# Patient Record
Sex: Male | Born: 1975 | Hispanic: Yes | Marital: Married | Smoking: Current some day smoker
Health system: Southern US, Community
[De-identification: ages and names within clinical notes are randomized; demographics above are authoritative.]

---

## 2011-07-11 ENCOUNTER — Ambulatory Visit
Admission: RE | Admit: 2011-07-11 | Discharge: 2011-07-11 | Disposition: A | Discharge status: Home / Self Care | Payer: No Typology Code available for payment source | Source: Ambulatory Visit | Attending: Emergency Medicine | Admitting: Emergency Medicine

## 2011-07-11 ENCOUNTER — Other Ambulatory Visit: Payer: Self-pay | Admitting: Emergency Medicine

## 2011-07-11 DIAGNOSIS — R1032 Left lower quadrant pain: Secondary | ICD-10-CM

## 2011-07-11 MED ORDER — IOHEXOL 300 MG/ML  SOLN: 100.0000 mL | Freq: Once | INTRAMUSCULAR | Status: AC | PRN

## 2012-01-05 IMAGING — CT CT ABD-PELV W/ CM
1 of 4 series · 15 of 36 positions shown, 19 images · IV contrast (30CC OMNI 300 & [ID] OMNI 300)
Comparison: None.

CLINICAL DATA: Left lower quadrant pain

CT ABDOMEN AND PELVIS WITH CONTRAST
TECHNIQUE: Multidetector CT imaging of the abdomen and pelvis was
performed following the standard protocol during bolus
administration of intravenous contrast.
Contrast: 100 ml Omnipaque 300

[Series 2: a&pw/ · axial · 0.70mm/px · z∈[-403,+32]mm · 15 of 94 slices shown, 19 images]
[im 4/94  soft-tissue]
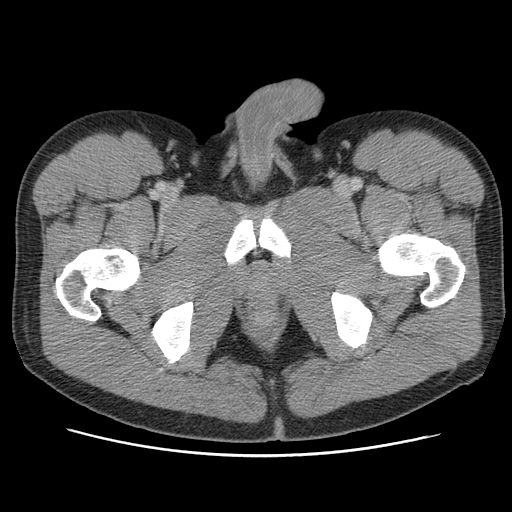
[im 4/94  bone]
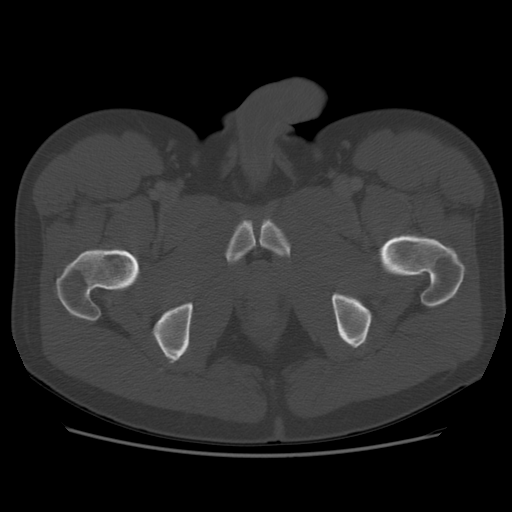
[im 12/94  soft-tissue]
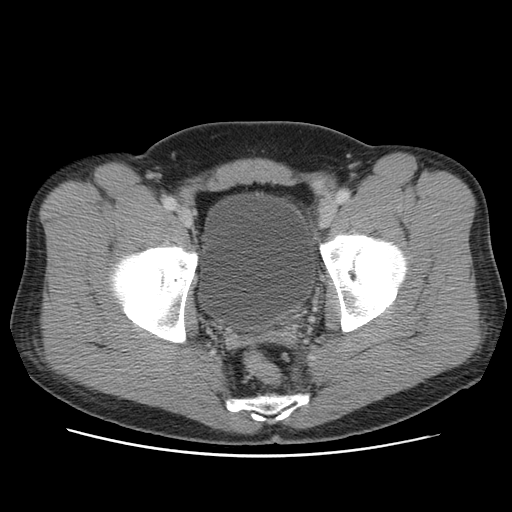
[im 20/94  soft-tissue]
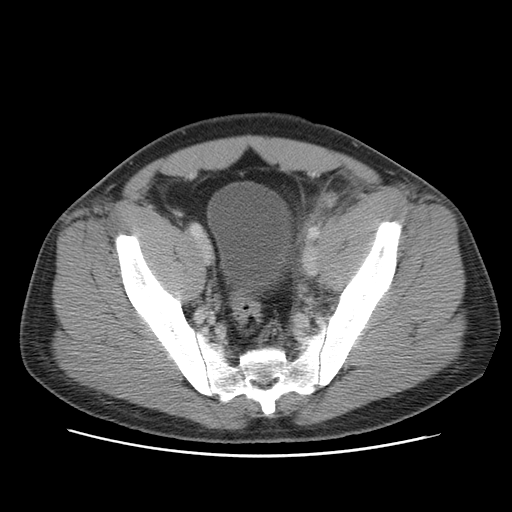
[im 28/94  soft-tissue]
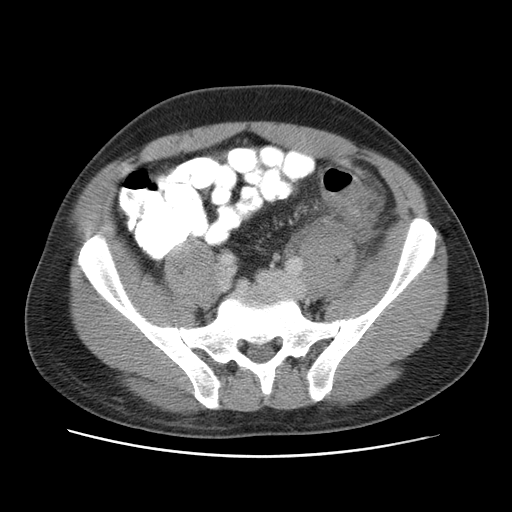
[im 32/94  soft-tissue]
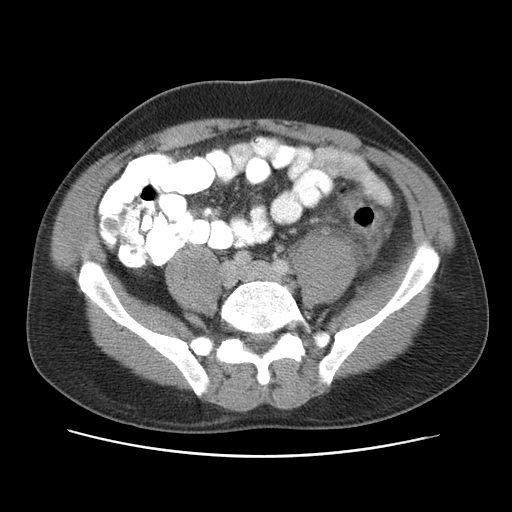
[im 39/94  soft-tissue]
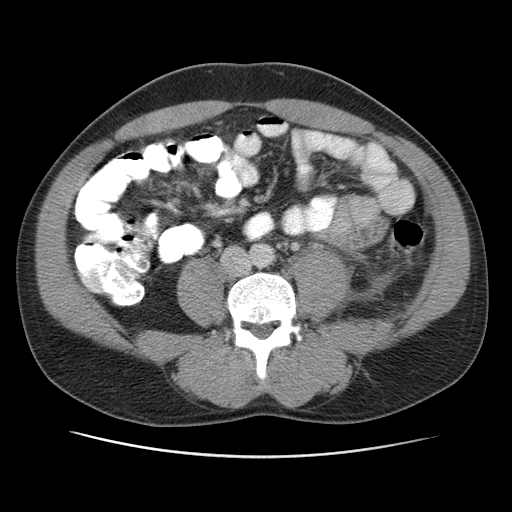
[im 47/94  soft-tissue]
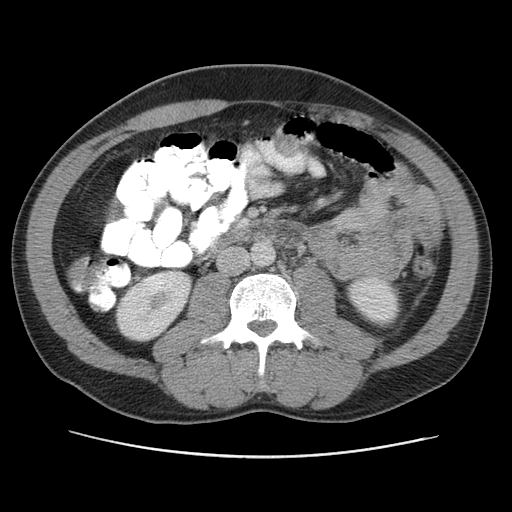
[im 55/94  soft-tissue]
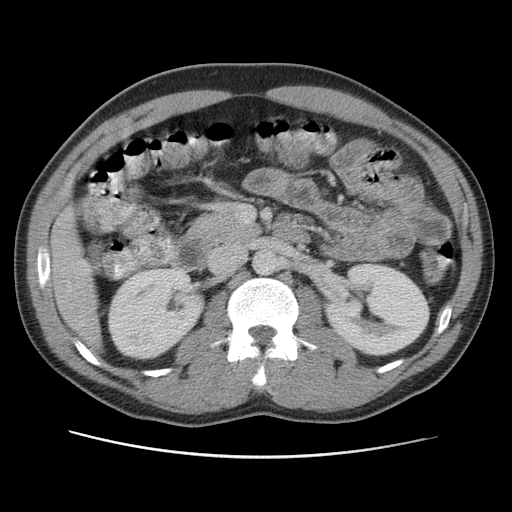
[im 63/94  soft-tissue]
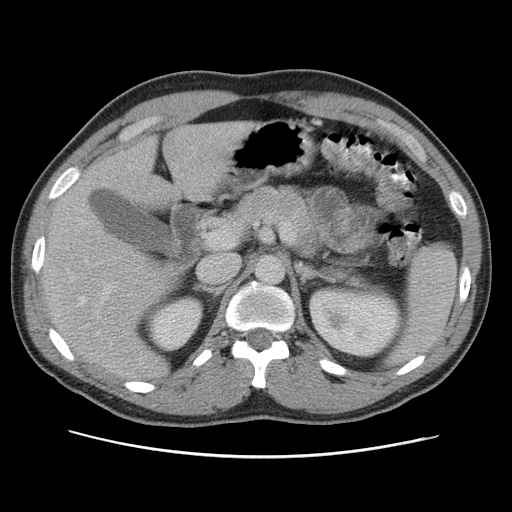
[im 63/94  bone]
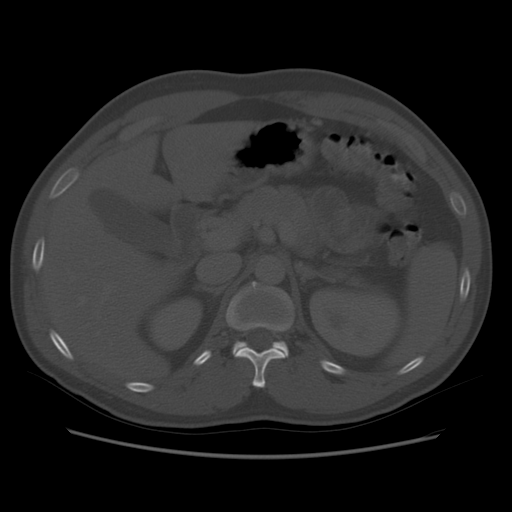
[im 66/94  soft-tissue]
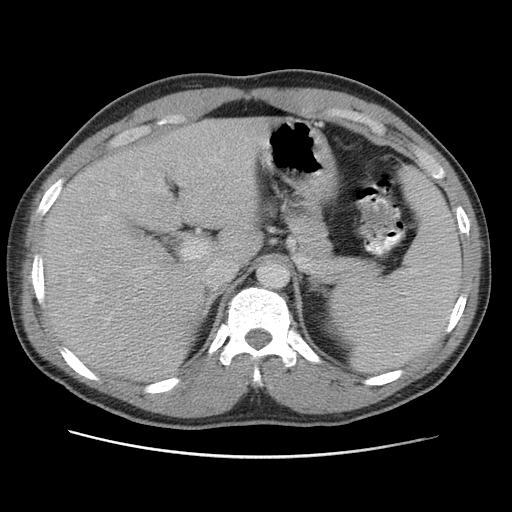
[im 74/94  soft-tissue]
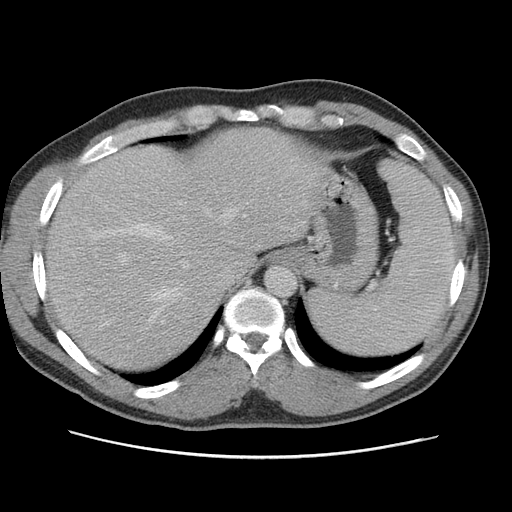
[im 78/94  lung]
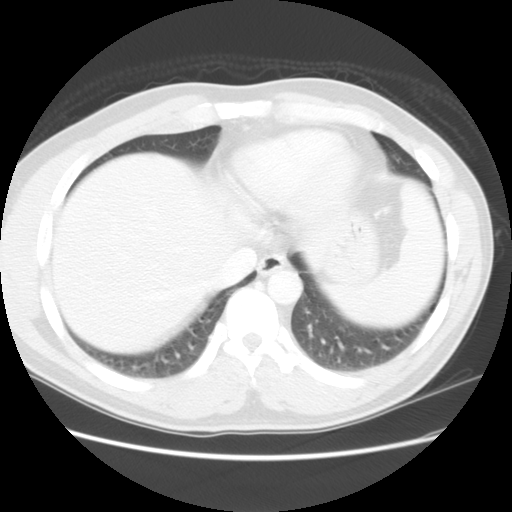
[im 82/94  soft-tissue]
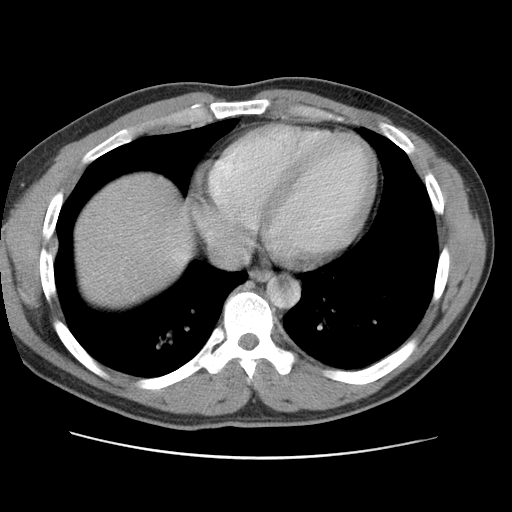
[im 82/94  lung]
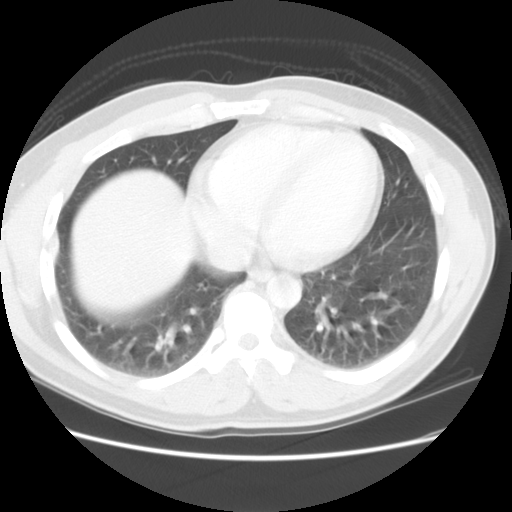
[im 86/94  lung]
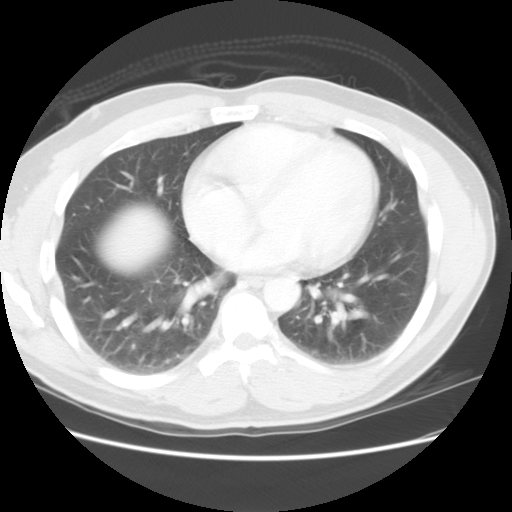
[im 90/94  soft-tissue]
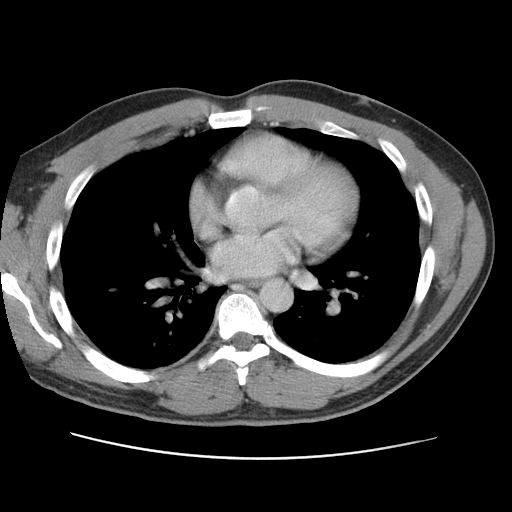
[im 90/94  lung]
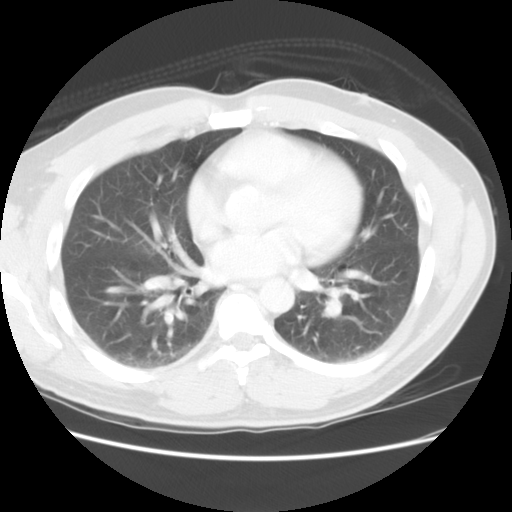

[15 of 36 positions shown; findings below may reference images not displayed]

FINDINGS: Lung bases are clear.  No pericardial fluid.

The liver, gallbladder, pancreas, spleen, adrenal glands, and
kidneys are normal.

The stomach, small bowel, and cecum are normal.  There is a coarse
calcification in the expected location of the appendix which
represent a 9 mm appendicolith.  There  are no secondary signs of
appendicitis.

Within the distal aspect the descending colon /  proximal aspect of
the sigmoid colon there is circumferential pericolonic
inflammation.  This is a region of multiple diverticula.  There is
thickening along the left pericolic gutter.  These findings are
most suggestive of acute diverticulitis.  No evidence of macro
perforation or abscess.  The more distal sigmoid colon and rectum
appear normal.

Abdominal aorta normal caliber.  No retroperitoneal
lymphadenopathy.

No free fluid the pelvis.  The bladder prostate gland appear
normal.  No pelvic lymphadenopathy.
IMPRESSION: 1.  Acute diverticulitis of the proximal sigmoid colon.  No
evidence of perforation or abscess.   Recommend follow up imaging
or colonoscopy to exclude underlying neoplasm.
2.  Appendicolith within the appendix without evidence of acute
appendicitis.

Findings were called to Dr. Bunji   by the CT technologist.

## 2014-05-31 ENCOUNTER — Ambulatory Visit (INDEPENDENT_AMBULATORY_CARE_PROVIDER_SITE_OTHER): Payer: Self-pay | Admitting: Emergency Medicine

## 2014-05-31 VITALS — BP 126/82 | HR 59 | Temp 98.6°F | Resp 20 | Ht 66.0 in | Wt 177.4 lb

## 2014-05-31 DIAGNOSIS — S6990XA Unspecified injury of unspecified wrist, hand and finger(s), initial encounter: Secondary | ICD-10-CM

## 2014-05-31 DIAGNOSIS — S6992XA Unspecified injury of left wrist, hand and finger(s), initial encounter: Secondary | ICD-10-CM

## 2014-05-31 DIAGNOSIS — Z23 Encounter for immunization: Secondary | ICD-10-CM

## 2014-05-31 DIAGNOSIS — S61412A Laceration without foreign body of left hand, initial encounter: Secondary | ICD-10-CM

## 2014-05-31 DIAGNOSIS — S61409A Unspecified open wound of unspecified hand, initial encounter: Secondary | ICD-10-CM

## 2014-05-31 NOTE — Progress Notes (Signed)
Procedure Note: Verbal consent obtained from the patient.  Local anesthesia with 2 cc Lidocaine 2% without epinephrine.  Wound scrubbed with soap and water.  Wound explored.  No foreign bodies or deep structure injury noted.  Wound closed with #3 sutures of 5-0 ethilon (#1HM, #2SI.)  Area cleansed and dressed.  Pt tolerated very well.  Discussed wound care.  Anticipate suture removal in 7-10 days.

## 2014-05-31 NOTE — Progress Notes (Signed)
   Subjective:    Patient ID: Mario Perry, male    DOB: 05/09/1976, 38 y.o.   MRN: [id number]  HPI 38 yo male with complaint of cutting hand with dry wall razor blade.  Laceration to left palm.  No difficulty with ROM.  Washed wound immediately.  Minimal pain.  No numbness or tingling distal to laceration.  PPMH:  Noncontributory  SH:  Nonsmoker, occasional alcohol   Review of Systems As per HPI, otherwise negative.    Objective:   Physical Exam Blood pressure 126/82, pulse 59, temperature 98.6 F (37 C), temperature source Oral, resp. rate 20, height 5\' 6"  (1.676 m), weight 177 lb 6 oz (80.457 kg), SpO2 96.00%. Body mass index is 28.64 kg/(m^2). Well-developed, well nourished male who is awake, alert and oriented, in NAD. HEENT: [place]/AT, PERRL, EOMI.  Sclera and conjunctiva are clear.   Lungs: normal effort Extremities: left hand with 1cm laceration to palm, just proximal to 5th MCP joint.  No vascular or tendon compromise on exam.  Sensation intact. Skin: warm and dry without rash. Psychologic: good mood and appropriate affect, normal speech and behavior.     Assessment & Plan:  1.  Laceration to hand 1cm repaired.  Tetanus updated today.

## 2014-05-31 NOTE — Patient Instructions (Signed)

## 2015-03-21 ENCOUNTER — Ambulatory Visit (INDEPENDENT_AMBULATORY_CARE_PROVIDER_SITE_OTHER): Payer: Self-pay | Admitting: Internal Medicine

## 2015-03-21 VITALS — BP 98/70 | HR 72 | Temp 98.3°F | Resp 16 | Ht 66.0 in | Wt 175.5 lb

## 2015-03-21 DIAGNOSIS — F1010 Alcohol abuse, uncomplicated: Secondary | ICD-10-CM

## 2015-03-21 DIAGNOSIS — R1031 Right lower quadrant pain: Secondary | ICD-10-CM

## 2015-03-21 DIAGNOSIS — Z8719 Personal history of other diseases of the digestive system: Secondary | ICD-10-CM

## 2015-03-21 LAB — POCT CBC
Granulocyte percent: 70.1 %G (ref 37–80)
HCT, POC: 44 % (ref 43.5–53.7)
Hemoglobin: 14.7 g/dL (ref 14.1–18.1)
LYMPH, POC: 1.9 (ref 0.6–3.4)
MCH: 31.1 pg (ref 27–31.2)
MCHC: 33.4 g/dL (ref 31.8–35.4)
MCV: 93 fL (ref 80–97)
MID (CBC): 0.8 (ref 0–0.9)
MPV: 6.9 fL (ref 0–99.8)
PLATELET COUNT, POC: 224 10*3/uL (ref 142–424)
POC Granulocyte: 6.4 (ref 2–6.9)
POC LYMPH %: 21 % (ref 10–50)
POC MID %: 8.9 % (ref 0–12)
RBC: 4.73 M/uL (ref 4.69–6.13)
RDW, POC: 12.2 %
WBC: 9.1 10*3/uL (ref 4.6–10.2)

## 2015-03-21 MED ORDER — CIPROFLOXACIN HCL 500 MG PO TABS: 500.0000 mg | ORAL_TABLET | Freq: Two times a day (BID) | ORAL | Status: AC

## 2015-03-21 MED ORDER — METRONIDAZOLE 500 MG PO TABS: 500.0000 mg | ORAL_TABLET | Freq: Three times a day (TID) | ORAL | Status: AC

## 2015-03-21 NOTE — Patient Instructions (Signed)
Diverticulitis (Diverticulitis) La diverticulitis es la inflamacin o infeccin de pequeas bolsas en el colon que se forman por una afeccin llamada diverticulosis. Las bolsas en el colon se denominan divertculos. El colon, o intestino grueso, es el lugar donde se absorbe agua y se forman las heces. [place] complicaciones de la diverticulitis, se incluyen:  Hemorragias.  Infeccin grave.  Dolor intenso.  Perforacin del colon.  Obstruccin del colon. CAUSAS  La diverticulitis est causada por bacterias y La diverticulitis se produce cuando queda materia fecal retenida en los divertculos. Esto permite que crezcan bacterias en los divertculos, lo que puede llevar a la inflamacin e infeccin. [place] con diverticulosis corren riesgo de presentar diverticulitis. Las dietas que no incluyen suficiente fibra proveniente de frutas y Photographer pueden predisponer a la diverticulitis. [place] los sntomas de diverticulitis, se incluyen:  Dolor y Scientist, research (life sciences) en el abdomen. El dolor en general aparece en el lado izquierdo del abdomen, pero puede aparecer en otras zonas.  Fiebre o escalofros  Hinchazn.  Clicos.  Nuseas.  Vmitos.  Estreimiento.  Diarrea.  Sangre en la materia fecal. DIAGNSTICO  El mdico le preguntar acerca de sus antecedentes mdicos y le har un examen fsico. Es posible que le hagan estudios, porque hay muchas enfermedades que pueden causar los mismos sntomas que la diverticulitis. Los estudios pueden incluir:  Anlisis de [place].  Anlisis de [place].  Estudios por imgenes del abdomen, entre ellos, radiografas y tomografas computarizadas. Cuando la afeccin est controlada, el mdico puede recomendarle que se haga una colonoscopa. La colonoscopa puede mostrar la gravedad de los divertculos y si hay algo ms que est causando los sntomas. TRATAMIENTO  La mayora de los casos de diverticulitis son leves y pueden  tratarse Financial planner. El tratamiento puede incluir:  Tomar medicamentos para el dolor de [place].  Seguir una dieta lquida absoluta.  Tomar antibiticos por va oral durante 7a 10das. Los casos ms graves pueden tratarse [employer] hospital. El tratamiento puede incluir:  No comer ni beber nada.  Tomar medicamentos para el dolor recetados.  Recibir antibiticos a travs de una va IV.  Recibir lquidos y nutricin a travs de una va IV.  [place]. INSTRUCCIONES PARA EL CUIDADO EN EL HOGAR   Siga cuidadosamente las indicaciones del mdico.  Siga las indicaciones del mdico acerca de si debe consumir una dieta lquida o de otro tipo. Cuando los sntomas mejoren, el mdico puede indicarle que modifique la dieta y recomendarle que consuma una dieta con alto contenido de [place]. Las frutas y los vegetales son buenas fuentes de [place]. La fibra facilita la eliminacin de heces.  Tome los suplementos con fibra o los probiticos segn las indicaciones del [place] los medicamentos solamente como se lo haya indicado el mdico.  [place] con todas las visitas de control. SOLICITE ATENCIN MDICA SI:   El dolor no mejora.  Le resulta difcil alimentarse.  Los movimientos intestinales no se normalizan. SOLICITE ATENCIN MDICA DE INMEDIATO SI:   El dolor empeora.  Los sntomas no mejoran.  Los sntomas empeoran repentinamente.  Tiene fiebre.  Ha vomitado repetidas veces.  La materia fecal es sanguinolenta o negra, de aspecto alquitranado. ASEGRESE DE QUE:   Comprende estas instrucciones.  Controlar su afeccin.  Recibir ayuda de inmediato si no mejora o si empeora. Document Released: 09/04/2005 Document Revised: 11/30/2013 [facility] Patient Information 2015 [place]. This information is not intended to replace advice given to you by your health care  provider. Make sure you discuss any questions you have with your health care provider.   Metronidazole  extended-release tablets Qu es este medicamento? El METRONIDAZOL es un antiinfeccioso. Se utiliza en el tratamiento de ciertos tipos de infecciones bacterianas y por protozoos. No es efectivo para resfros, gripe u otras infecciones de origen viral. Este medicamento puede ser utilizado para otros usos; si tiene alguna pregunta consulte con su proveedor de atencin mdica o con su farmacutico. MARCAS COMERCIALES DISPONIBLES: Flagyl ER Qu le debo informar a mi profesional de la salud antes de tomar este medicamento? Necesita saber si usted presenta alguno de los siguientes problemas o situaciones: -anemia u otros trastornos sanguneos -enfermedad del sistema nervioso -infeccin mictica o por levadura -si consume bebidas alcohlicas con frecuencia -enfermedad heptica -convulsiones -una reaccin alrgica o inusual al metronidazol, a otros medicamentos, alimentos, colorantes o conservantes -si est embarazada o buscando quedar embarazada -si est amamantando a un beb Cmo debo utilizar este medicamento? Tome este medicamento por va oral con un vaso lleno de agua. Siga las instrucciones de la etiqueta del [place]. No lo triture ni mastique. Tome este medicamento con el estmago vaco, 1 hora antes o 2 horas despus de las comidas o de ingerir alimentos. Tome sus dosis a intervalos regulares. No tome su medicamento con una frecuencia mayor a la indicada. Complete todo el tratamiento con el medicamento como recetado aun si se siente mejor. No omita ninguna dosis o suspenda el uso de su medicamento antes de lo indicado. Hable con su pediatra para informarse acerca del uso de este medicamento en nios. Puede requerir atencin especial. Sobredosis: Pngase en contacto inmediatamente con un centro toxicolgico o una sala de urgencia si usted cree que haya tomado demasiado medicamento. ATENCIN: [employer] es solo para usted. No comparta este medicamento con nadie. Qu sucede si me  olvido de una dosis? Si olvida una dosis, tmela lo antes posible. Si es casi la hora de la prxima dosis, tome slo esa dosis. No tome dosis adicionales o dobles. Qu puede interactuar con este medicamento? No tome esta medicina con ninguno de los siguientes medicamentos: -alcohol o cualquier producto que contiene alcohol -solucin oral de amprenavir -cisapride -disulfiram -dofetilida -dronedarona -inyeccin de paclitaxel -pimozida -solucin oral de ritonavir -solucin oral de sertralina -inyeccin de sulfametoxasol-trimetoprima -tioridazina -ziprasidona Esta medicina tambin puede interactuar con los siguientes medicamentos: -pldoras anticonceptivas -cimetidina -litio -otros medicamentos que prolongan el intervalo QT (provoca un ritmo cardiaco anormal) -fenobarbital -fenitona -warfarina Puede ser que esta lista no menciona todas las posibles interacciones. Informe a su profesional de [employer]	[place] de [employer] productos a base de hierbas, medicamentos de [place] o suplementos nutritivos que est tomando. Si usted fuma, consume bebidas alcohlicas o si utiliza drogas ilegales, indqueselo tambin a su profesional de [employer]	[place]. Algunas sustancias pueden interactuar con su medicamento. A qu debo estar atento al usar [employer]? Consulte a su mdico o a su profesional de la salud si sus sntomas no mejoran o si empeoran. Puede experimentar mareos o somnolencia. No conduzca ni utilice maquinaria ni haga nada que Associate Professor en estado de alerta hasta que sepa cmo le afecta este medicamento. No se siente ni se ponga de pie con rapidez, especialmente si es un paciente de edad avanzada. Esto reduce el riesgo de mareos o [employer]. Evite las bebidas alcohlicas durante el tratamiento con este medicamento y [employer] tres das siguientes. El alcohol puede causarle mareos o hacerlo sentir enfermo o ruborizado. Si est recibiendo tratamiento para Ardelia Mems  enfermedad de transmisin  sexual, no tenga relaciones sexuales hasta que haya completado el tratamiento. Es posible que su pareja tambin necesite [place]. Qu efectos secundarios puedo tener al [employer] este medicamento? Efectos secundarios que debe informar a su mdico o a Barrister's clerk de la salud tan pronto como sea posible: -Chief of Staff como erupcin cutnea o urticarias, hinchazn de la cara, labios o lengua -confusin, torpeza -dificultad para hablar -decoloracin o dolor de la boca -mareos -fiebre, infeccin -entumecimiento, hormigueo, dolor o debilidad en las manos o los pies -dificultad para orinar o cambios en el volumen de orina -enrojecimiento, formacin de ampollas, descamacin o distensin de la piel, inclusive dentro de la boca -convulsiones -cansancio o debilidad inusual -irritacin, resequedad o flujo de la vagina Efectos secundarios que, por lo general, no requieren atencin mdica (debe informarlos a su mdico o a su profesional de la salud si persisten o si son molestos): -diarrea -dolor de cabeza -irritabilidad -sabor metlico -nuseas -calambres o dolores estomacales -dificultad para conciliar el sueo Puede ser que esta lista no menciona todos los posibles efectos secundarios. Comunquese a su mdico por asesoramiento mdico [employer]. Usted puede informar los efectos secundarios a la FDA por telfono al 1-800-FDA-1088. Dnde debo guardar mi medicina? Mantngala fuera del alcance de los nios. [place] a [employer], entre 15 y 81 grados C (70 y 65 grados F). Protjala de la luz y la humedad. Mantenga el envase bien cerrado. Deseche todo el medicamento que no haya utilizado, despus de la fecha de vencimiento. ATENCIN: Este folleto es un resumen. Puede ser que no cubra toda la posible informacin. Si usted tiene preguntas acerca de esta medicina, consulte con su mdico, su farmacutico o su profesional de Technical sales engineer.  2015, Elsevier/Gold  Standard. (2013-09-27 17:56:07)

## 2015-03-21 NOTE — Progress Notes (Signed)
MRN: [id number] DOB: [birth year]  Subjective:   Mario Perry is a 39 y.o. male presenting for chief complaint of Abdominal Pain  Reports onset of RLQ pain since this morning. Has not tried any medications for relief. Has a history of diverticulitis, 2012 and 2014 per patient, states that his RLQ pain is the same albeit milder than previous diverticulitis episodes. Was given antibiotics both times with resolution of symptoms, last time was given Septra DS. Today, denies fevers, n/v, bloody stool, pelvic pain, rectal pain, hard stools, constipation, dysuria, hematuria. Denies history of Crohn or UC, family history of colon cancer. Denies smoking, drinks alcohol heavily ~4 during the week, 6-12 drinks per day on some weekends. Diet is unhealthy, very little fiber. Denies any other aggravating or relieving factors, no other questions or concerns.  Mario Perry currently has no medications in their medication list. He has No Known Allergies.  Mario Perry  has no past medical history on file. Also  has no past surgical history on file.  ROS As in subjective.  Objective:   Vitals: BP 98/70 mmHg  Pulse 72  Temp(Src) 98.3 F (36.8 C) (Oral)  Resp 16  Ht  (1.676 m)  Wt 175 lb 8 oz (79.606 kg)  BMI 28.34 kg/m2  SpO2 98%  Physical Exam  Constitutional: He is oriented to person, place, and time and well-developed, well-nourished, and in no distress.  HENT:  Mouth/Throat: Oropharynx is clear and moist. No oropharyngeal exudate.  Eyes: Conjunctivae are normal. No scleral icterus.  Cardiovascular: Normal rate, regular rhythm and intact distal pulses.  Exam reveals no gallop and no friction rub.   No murmur heard. Pulmonary/Chest: No respiratory distress. He has no wheezes. He has no rales. He exhibits no tenderness.  Abdominal: He exhibits no distension and no mass. There is no tenderness.  Musculoskeletal: Normal range of motion. He exhibits no edema or tenderness.  Neurological: He is alert and  oriented to person, place, and time.  Skin: Skin is warm and dry. No rash noted. No erythema. No pallor.   Results for orders placed or performed in visit on 03/21/15 (from the past 24 hour(s))  POCT CBC     Status: None   Collection Time: 03/21/15  9:10 PM  Result Value Ref Range   WBC 9.1 4.6 - 10.2 K/uL   Lymph, poc 1.9 0.6 - 3.4   POC LYMPH PERCENT 21.0 10 - 50 %L   MID (cbc) 0.8 0 - 0.9   POC MID % 8.9 0 - 12 %M   POC Granulocyte 6.4 2 - 6.9   Granulocyte percent 70.1 37 - 80 %G   RBC 4.73 4.69 - 6.13 M/uL   Hemoglobin 14.7 14.1 - 18.1 g/dL   HCT, POC 40.9 81.1 - 53.7 %   MCV 93.0 80 - 97 fL   MCH, POC 31.1 27 - 31.2 pg   MCHC 33.4 31.8 - 35.4 g/dL   RDW, POC 91.4 %   Platelet Count, POC 224 142 - 424 K/uL   MPV 6.9 0 - 99.8 fL   Assessment and Plan :   1. RLQ abdominal pain 2. History of diverticulitis of colon - Will treat clinically, given history. Start Cipro and Flagyl x7 days. Advised to make dietary modifications. Patient did not want to CT scan but agreed that if he does not have improvement, we will proceed with this in 1 week.  3. Alcohol abuse - Recommended decreasing alcohol use, advised not to take this while  on Flagyl due to potential for adverse reactions, patient agreed.  Mario BambergMario [name], PA-C [facility] [phone] 03/21/2015 8:49 PM  I have participated in the care of this patient with the Advanced Practice Provider and agree with Diagnosis and Plan as documented. Robert P. Merla Richesoolittle, M.D.

## 2015-03-22 LAB — COMPREHENSIVE METABOLIC PANEL
ALBUMIN: 4.4 g/dL (ref 3.5–5.2)
ALK PHOS: 67 U/L (ref 39–117)
ALT: 13 U/L (ref 0–53)
AST: 16 U/L (ref 0–37)
BUN: 15 mg/dL (ref 6–23)
CALCIUM: 9.2 mg/dL (ref 8.4–10.5)
CHLORIDE: 102 meq/L (ref 96–112)
CO2: 28 mEq/L (ref 19–32)
Creat: 0.8 mg/dL (ref 0.50–1.35)
Glucose, Bld: 84 mg/dL (ref 70–99)
POTASSIUM: 4.3 meq/L (ref 3.5–5.3)
SODIUM: 140 meq/L (ref 135–145)
TOTAL PROTEIN: 6.9 g/dL (ref 6.0–8.3)
Total Bilirubin: 0.6 mg/dL (ref 0.2–1.2)

## 2015-03-22 LAB — LIPASE: Lipase: 10 U/L (ref 0–75)

## 2015-03-23 ENCOUNTER — Telehealth: Payer: Self-pay

## 2015-03-23 DIAGNOSIS — Z8719 Personal history of other diseases of the digestive system: Secondary | ICD-10-CM

## 2015-03-23 DIAGNOSIS — K5900 Constipation, unspecified: Secondary | ICD-10-CM

## 2015-03-23 MED ORDER — SULFAMETHOXAZOLE-TRIMETHOPRIM 800-160 MG PO TABS: 1.0000 | ORAL_TABLET | Freq: Two times a day (BID) | ORAL | Status: AC

## 2015-03-23 NOTE — Telephone Encounter (Signed)
[facility] and he would like to receive a phone call for some advice. Patient states that medication that was prescribed isn't working and he is experiencing a lot of stomach pain and it's so extreme that he can't walk. Please call! [phone]

## 2015-03-23 NOTE — Telephone Encounter (Signed)
Assessment and Plan :   1. RLQ abdominal pain 2. History of diverticulitis of colon - Will treat clinically, given history. Start Cipro and Flagyl x7 days. Advised to make dietary modifications. Patient did not want to CT scan but agreed that if he does not have improvement, we will proceed with this in 1 week.  3. Alcohol abuse - Recommended decreasing alcohol use, advised not to take this while on Flagyl due to potential for adverse reactions, patient agreed.  Wallis BambergMario Mani, PA-C [facility] [phone] 03/21/2015 8:49 PM

## 2015-03-23 NOTE — Telephone Encounter (Signed)
I spoke with patient. He states that his abdominal pain is worse, he is also now having watery diarrhea x10 BM today, nausea and vomiting x1, subjective fever. Patient is still refusing to do abdominal CT. I advised him the risks. He states that he wants to try this before he does any other work-up. I advised him that this is against my medical advice. Patient verbalized understanding. He will try Septra DS which he firmly believes is what helped him in the past for his other episodes of diverticulitis. Agreed to repeat the CT scan if his symptoms fail to resolve.  Wallis BambergMario Johannah Rozas, PA-C [facility] [phone] 03/23/2015  6:29 PM

## 2015-03-28 ENCOUNTER — Other Ambulatory Visit: Payer: Self-pay | Admitting: Urgent Care

## 2015-03-29 ENCOUNTER — Telehealth: Payer: Self-pay

## 2015-03-29 NOTE — Telephone Encounter (Signed)
Patient is having difficulty getting his medication from the pharmacy. [facility] Please call! [phone] Patient states that he took his last pill yesterday and is in a little bit of pain today. Patient also states that he would be able to make it [birth year] on [birth year]!

## 2015-04-03 ENCOUNTER — Other Ambulatory Visit: Payer: Self-pay | Admitting: Urgent Care

## 2015-04-04 NOTE — Telephone Encounter (Signed)
Patient reports that he his symptoms are significantly improved. He finished the Flagyl and Cipro course ~1 week ago. Still has residual mild RLQ pain. He has made significant dietary modifications, states that he is eating much healthier and is making a solid effort to increase his fiber intake in the setting of diverticulitis. He had stopped drinking alcohol but had ~4 beers this past Saturday. I advised him that binge drinking in that way will not help his GI system. I recommended that if he cannot limit his drinking, he should stop all together. Patient verbalized understanding. He requested another course of Flagyl and Cipro, however I advised that we hold off on this given his improvement and mild symptoms currently. If his pain worsens, I advised that he call me, will consider CT scan or referral to GI specialist. Patient agreed.

## 2015-07-03 NOTE — Progress Notes (Signed)
Note reviewed, and agree with documentation and plan.
